# Patient Record
Sex: Female | Born: 2000 | Race: White | Hispanic: No | Marital: Single | Smoking: Never smoker
Health system: Southern US, Community
[De-identification: ages and names within clinical notes are randomized; demographics above are authoritative.]

## PROBLEM LIST (undated history)

## (undated) DIAGNOSIS — R519 Headache, unspecified: Secondary | ICD-10-CM

## (undated) HISTORY — DX: Headache, unspecified: R51.9

---
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
## 2021-05-03 ENCOUNTER — Emergency Department (HOSPITAL_BASED_OUTPATIENT_CLINIC_OR_DEPARTMENT_OTHER)
Admission: EM | Admit: 2021-05-03 | Discharge: 2021-05-03 | Disposition: A | Discharge status: Home / Self Care | Payer: BC Managed Care – PPO | Attending: Emergency Medicine | Admitting: Emergency Medicine

## 2021-05-03 ENCOUNTER — Other Ambulatory Visit: Payer: Self-pay

## 2021-05-03 DIAGNOSIS — Z203 Contact with and (suspected) exposure to rabies: Secondary | ICD-10-CM | POA: Diagnosis present

## 2021-05-03 DIAGNOSIS — Z23 Encounter for immunization: Secondary | ICD-10-CM | POA: Diagnosis not present

## 2021-05-03 DIAGNOSIS — Z2914 Encounter for prophylactic rabies immune globin: Secondary | ICD-10-CM | POA: Insufficient documentation

## 2021-05-03 DIAGNOSIS — Z209 Contact with and (suspected) exposure to unspecified communicable disease: Secondary | ICD-10-CM

## 2021-05-03 MED ORDER — RABIES VACCINE, PCEC IM SUSR
1.0000 mL | Freq: Once | INTRAMUSCULAR | Status: AC
  Administered 2021-05-03: 1 mL via INTRAMUSCULAR
  Filled 2021-05-03: qty 1

## 2021-05-03 MED ORDER — RABIES IMMUNE GLOBULIN 150 UNIT/ML IM INJ
20.0000 [IU]/kg | INJECTION | Freq: Once | INTRAMUSCULAR | Status: AC
  Administered 2021-05-03: 1350 [IU] via INTRAMUSCULAR
  Filled 2021-05-03: qty 10
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
## 2021-05-03 NOTE — Discharge Instructions (Addendum)
Follow up with [facility] Monday morning to schedule outpatient rabies follow up injections. Today is day 0 and you should receive your next injection on Wednesday 05/06/21.

## 2021-05-06 ENCOUNTER — Ambulatory Visit (HOSPITAL_COMMUNITY)
Admission: RE | Admit: 2021-05-06 | Discharge: 2021-05-06 | Disposition: A | Discharge status: Home / Self Care | Payer: BC Managed Care – PPO | Source: Ambulatory Visit | Attending: Internal Medicine | Admitting: Internal Medicine

## 2021-05-06 ENCOUNTER — Other Ambulatory Visit: Payer: Self-pay

## 2021-05-06 ENCOUNTER — Encounter (HOSPITAL_COMMUNITY): Payer: Self-pay

## 2021-05-06 DIAGNOSIS — Z203 Contact with and (suspected) exposure to rabies: Secondary | ICD-10-CM | POA: Diagnosis not present

## 2021-05-06 MED ORDER — RABIES VACCINE, PCEC IM SUSR
INTRAMUSCULAR | Status: AC
  Filled 2021-05-06: qty 1

## 2021-05-06 MED ORDER — RABIES VACCINE, PCEC IM SUSR
1.0000 mL | Freq: Once | INTRAMUSCULAR | Status: AC
  Administered 2021-05-06: 1 mL via INTRAMUSCULAR

## 2021-05-06 NOTE — ED Triage Notes (Signed)
Pt presents fro Rabies vaccine #2. Denies nay reaction the the Rabies vaccine.

## 2021-05-10 ENCOUNTER — Ambulatory Visit (HOSPITAL_COMMUNITY)
Admission: RE | Admit: 2021-05-10 | Discharge: 2021-05-10 | Disposition: A | Discharge status: Home / Self Care | Payer: BC Managed Care – PPO | Source: Ambulatory Visit | Attending: Internal Medicine | Admitting: Internal Medicine

## 2021-05-10 ENCOUNTER — Other Ambulatory Visit: Payer: Self-pay

## 2021-05-10 DIAGNOSIS — Z203 Contact with and (suspected) exposure to rabies: Secondary | ICD-10-CM | POA: Diagnosis not present

## 2021-05-10 MED ORDER — RABIES VACCINE, PCEC IM SUSR
INTRAMUSCULAR | Status: AC
  Filled 2021-05-10: qty 1

## 2021-05-10 MED ORDER — RABIES VACCINE, PCEC IM SUSR
1.0000 mL | Freq: Once | INTRAMUSCULAR | Status: AC
  Administered 2021-05-10: 1 mL via INTRAMUSCULAR

## 2021-05-10 NOTE — ED Triage Notes (Signed)
Pt came in to get her rabies vaccination. Pt would like the injection in her right deltoid

## 2021-05-17 ENCOUNTER — Other Ambulatory Visit: Payer: Self-pay

## 2021-05-17 ENCOUNTER — Ambulatory Visit (HOSPITAL_COMMUNITY)
Admission: RE | Admit: 2021-05-17 | Discharge: 2021-05-17 | Disposition: A | Discharge status: Home / Self Care | Payer: BC Managed Care – PPO | Source: Ambulatory Visit | Attending: Internal Medicine | Admitting: Internal Medicine

## 2021-05-17 ENCOUNTER — Encounter (HOSPITAL_COMMUNITY): Payer: Self-pay

## 2021-05-17 DIAGNOSIS — Z203 Contact with and (suspected) exposure to rabies: Secondary | ICD-10-CM | POA: Diagnosis not present

## 2021-05-17 MED ORDER — RABIES VACCINE, PCEC IM SUSR
INTRAMUSCULAR | Status: AC
  Filled 2021-05-17: qty 1

## 2021-05-17 MED ORDER — RABIES VACCINE, PCEC IM SUSR
1.0000 mL | Freq: Once | INTRAMUSCULAR | Status: AC
  Administered 2021-05-17: 1 mL via INTRAMUSCULAR

## 2021-05-17 NOTE — ED Triage Notes (Signed)
Patient presents for #4  of Rabies Vaccine, verified previous doses and treatment in chart.

## 2022-09-01 ENCOUNTER — Encounter: Payer: Self-pay | Admitting: *Deleted

## 2022-09-02 NOTE — Progress Notes (Signed)
Referring:  Kaitlyn Lund, PA [address] [place],  [place] [address]  PCP: Kaitlyn Lund, PA  Neurology was asked to evaluate Kaitlyn Dorsey, a 21 year old female for a chief complaint of headaches.  Our recommendations of care will be communicated by shared medical record.    CC:  headaches  History provided from self, mother Kaitlyn Dorsey  HPI:  Medical co-morbidities: none  The patient presents for evaluation of headaches which began at age 40. They occur once every 2-3 months. Headaches are described as sharp pain in her R>L occiput which may radiate down her neck or up the back of her head. It is triggered by turning her head. Headaches last ~2 minutes at a time. She does not take anything for them because they are so short lasting.  Headache History: Onset: age 48 Triggers: turning her head Aura: none Location: R>L occiput Quality/Description: sharp Associated Symptoms:  Photophobia: no  Phonophobia: no  Nausea: no Worse with activity?: no Duration of headaches: 2 minutes  Headache days per month: 1 Migraine days per month: 0 Headache free days per month: 29  Current Treatment: Abortive none  Preventative none  Prior Therapies                                 none   LABS: 07/26/22 CBC, CMP, iron wnl  IMAGING:  Thinks she had an MRI or CT last year but is not sure. These records are not available for review  No current outpatient medications on file prior to visit.   No current facility-administered medications on file prior to visit.     Allergies: No Known Allergies  Family History: Migraine or other headaches in the family:  no Aneurysms in a first degree relative:  no Brain tumors in the family:  no Other neurological illness in the family:   no  Past Medical History: Past Medical History:  Diagnosis Date   Headache     Past Surgical History No past surgical history on file.  Social History: Social History   Tobacco  Use   Smoking status: Never   Smokeless tobacco: Never  Vaping Use   Vaping Use: Never used  Substance Use Topics   Alcohol use: No    Comment: rare   Drug use: No    ROS: Negative for fevers, chills. Positive for headaches. All other systems reviewed and negative unless stated otherwise in HPI.   Physical Exam:   Vital Signs: BP 110/63   Pulse 85   Ht 5\' 7"  (1.702 m)   Wt 155 lb (70.3 kg)   BMI 24.28 kg/m  GENERAL: well appearing,in no acute distress,alert SKIN:  Color, texture, turgor normal. No rashes or lesions HEAD:  Normocephalic/atraumatic. CV:  RRR RESP: Normal respiratory effort MSK: no tenderness to palpation over occiput, neck, or shoulders  NEUROLOGICAL: Mental Status: Alert, oriented to person, place and time,Follows commands Cranial Nerves: PERRL, visual fields intact to confrontation, extraocular movements intact, facial sensation intact, no facial droop or ptosis, hearing grossly intact, no dysarthria Motor: muscle strength 5/5 both upper and lower extremities,no drift, normal tone Reflexes: 2+ throughout Sensation: intact to light touch all 4 extremities Coordination: Finger-to- nose-finger intact bilaterally Gait: normal-based   IMPRESSION: 21 year old female without medical history who presents for evaluation of headaches. Headaches are infrequent and brief, only occurring once every 2-3 months and lasting ~2 minutes at  a time. Her neurological exam today is normal. She denies neck pain or radicular symptoms in her upper extremities. 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 She does not feel they are bad enough to warrant treatment at this time. Discussed treatment options if headaches worsen including neck PT and PRN muscle relaxer. Supplement information for headache prevention provided. She will return to clinic as needed if headaches worsen.  PLAN: -Supplement information for  headache prevention provided -Next steps: consider neck PT, PRN muscle relaxer   I spent a total of 20 minutes chart reviewing and counseling the patient. Headache education was done. Discussed treatment options including acute medications, natural supplements, and physical therapy. Written educational materials and patient instructions outlining all of the above were given.  Follow-up: as needed   Genia Harold, MD 09/06/2022   9:53 AM

## 2022-09-06 ENCOUNTER — Ambulatory Visit: Payer: BC Managed Care – PPO | Admitting: Psychiatry

## 2022-09-06 VITALS — BP 110/63 | HR 85 | Ht 67.0 in | Wt 155.0 lb

## 2022-09-06 DIAGNOSIS — M5481 Occipital neuralgia: Secondary | ICD-10-CM

## 2022-09-06 NOTE — Patient Instructions (Addendum)
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 Pain is often described as a sharp, shooting pain at the back of the head. Other symptoms may include tenderness at the base of the head, neck pain, and a sensation of numbness, tingling, or crawling on the scalp. It is often associated with neck tension. When neck muscles are tight they can compress the nerve and cause irritation.   Treatment options include physical therapy for neck pain and tightness. We can also use medications for neuropathic pain (pain originating from a nerve) as well as muscle relaxers.   Natural supplements that can reduce headaches: Magnesium Oxide or Magnesium Glycinate 500 mg at bed (up to 800 mg daily) Coenzyme Q10 300 mg in AM Vitamin B2- 200 mg twice a day  Add 1 supplement at a time since even natural supplements can have undesirable side effects. You can sometimes buy supplements cheaper (especially Coenzyme Q10) at www.https://compton-perez.com/ or at [employer].  Magnesium: Magnesium (250 mg twice a day or 500 mg at bed) has a relaxant effect on smooth muscles such as blood vessels. Individuals suffering from frequent or daily headache usually have low magnesium levels which can be increase with daily supplementation of 400-800 mg. Three trials found 40-90% average headache reduction  when used as a preventative. Good sources include nuts, whole grains, and tomatoes. Side Effects: loose stool/diarrhea  Riboflavin (vitamin B 2) 200 mg twice a day. This vitamin assists nerve cells in the production of ATP a principal energy storing molecule.  It is necessary for many chemical reactions in the body.  Most Americans get more riboflavin than the recommended daily allowance, however riboflavin deficiency is not necessary for the supplements to help prevent headache. Side effects:  energizing, green urine  Coenzyme Q10: This is present in almost all cells in the body and is critical component for the conversion of energy.  Recent studies have shown that a nutritional supplement of CoQ10 can reduce the frequency of headache attacks by improving the energy production of cells as with riboflavin.  Doses of 150 mg twice a day have been shown to be effective.  Melatonin: 5-10 mg before bedtime. Increasing evidence shows correlation between melatonin secretion and headache conditions.  Melatonin supplementation has decreased headache intensity and duration.  It is widely used as a sleep aid.
